# Patient Record
Sex: Female | Born: 2000 | Race: White | Hispanic: No | Marital: Single | Smoking: Never smoker
Health system: Southern US, Community
[De-identification: ages and names within clinical notes are randomized; demographics above are authoritative.]

---
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
## 2022-01-14 ENCOUNTER — Ambulatory Visit
Admission: RE | Admit: 2022-01-14 | Discharge: 2022-01-14 | Disposition: A | Discharge status: Home / Self Care | Payer: BC Managed Care – PPO | Source: Ambulatory Visit

## 2022-01-14 DIAGNOSIS — S8001XA Contusion of right knee, initial encounter: Secondary | ICD-10-CM | POA: Diagnosis not present

## 2022-01-14 DIAGNOSIS — S7011XA Contusion of right thigh, initial encounter: Secondary | ICD-10-CM | POA: Diagnosis not present

## 2022-01-14 DIAGNOSIS — S5011XA Contusion of right forearm, initial encounter: Secondary | ICD-10-CM

## 2022-01-14 DIAGNOSIS — S8002XA Contusion of left knee, initial encounter: Secondary | ICD-10-CM | POA: Diagnosis not present
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
## 2022-01-14 NOTE — Discharge Instructions (Addendum)
Take over-the-counter Tylenol or ibuprofen according to the package instructions as needed for pain and inflammation.  You may apply ice to your various bruises for 20 minutes at a time 2-3 times a day to help with pain and inflammation.  Do not apply the ice directly to your skin make sure there is a cloth between your eyes and skin to prevent skin damage.  If any of the bruised areas become red, hot, swollen, or tender please return for reevaluation.

## 2023-01-02 ENCOUNTER — Ambulatory Visit
Admission: RE | Admit: 2023-01-02 | Discharge: 2023-01-02 | Disposition: A | Discharge status: Home / Self Care | Payer: BC Managed Care – PPO | Source: Ambulatory Visit | Attending: Physician Assistant | Admitting: Physician Assistant

## 2023-01-02 VITALS — BP 105/71 | HR 83 | Temp 98.6°F | Resp 14 | Ht 64.0 in | Wt 120.0 lb

## 2023-01-02 DIAGNOSIS — R051 Acute cough: Secondary | ICD-10-CM | POA: Diagnosis not present

## 2023-01-02 DIAGNOSIS — J069 Acute upper respiratory infection, unspecified: Secondary | ICD-10-CM

## 2023-01-02 MED ORDER — PREDNISONE 20 MG PO TABS: 40.0000 mg | ORAL_TABLET | Freq: Every day | ORAL | 0 refills | Status: AC

## 2023-01-02 MED ORDER — AZITHROMYCIN 250 MG PO TABS: 250.0000 mg | ORAL_TABLET | Freq: Every day | ORAL | 0 refills | Status: DC

## 2023-01-02 NOTE — ED Triage Notes (Signed)
Patient c/o cough and chest congestion for over 2 weeks.  Patient denies fevers.  

## 2023-01-02 NOTE — ED Provider Notes (Signed)
[facility]    CSN: [id number] Arrival date & time: 01/02/23  4098      History   Chief Complaint Chief Complaint  Patient presents with   Cough    Appointment    HPI Kaylee Williams is a 22 y.o. female presenting for approximately 3-week history of cough, congestion, runny nose.  Patient says symptoms seem to get better and then they will get worse again.  States that sometimes the symptoms will be very heavy on the cough and other times having congestion.  She denies fever, ear pain, sore throat.  Has not had any sinus pain, chest tightness or shortness of breath.  No vomiting or diarrhea.  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  She has tried over-the-counter antihistamines but says that has not helped symptoms and she does not really have a history of use.  No other complaints.  HPI  History reviewed. No pertinent past medical history.  There are no problems to display for this patient.   History reviewed. No pertinent surgical history.  OB History   No obstetric history on file.      Home Medications    Prior to Admission medications   Medication Sig Start Date End Date Taking? Authorizing Provider  azithromycin (ZITHROMAX) 250 MG tablet Take 1 tablet (250 mg total) by mouth daily. Take first 2 tablets together, then 1 every day until finished. 01/02/23  Yes Shirlee Latch, PA-C  predniSONE (DELTASONE) 20 MG tablet Take 2 tablets (40 mg total) by mouth daily for 5 days. 01/02/23 01/07/23 Yes Shirlee Latch, PA-C    Family History Family History  Problem Relation Age of Onset   Healthy Mother    Healthy Father     Social History Social History   Tobacco Use   Smoking status: Never   Smokeless tobacco: Never  Vaping Use   Vaping Use: Never used  Substance Use Topics   Alcohol use: Never   Drug use: Never     Allergies    Amoxicillin   Review of Systems Review of Systems  Constitutional:  Negative for chills, diaphoresis, fatigue and fever.  HENT:  Positive for congestion and rhinorrhea. Negative for ear pain, sinus pressure, sinus pain and sore throat.   Respiratory:  Positive for cough. Negative for shortness of breath.   Gastrointestinal:  Negative for abdominal pain, nausea and vomiting.  Musculoskeletal:  Negative for arthralgias and myalgias.  Skin:  Negative for rash.  Neurological:  Negative for weakness and headaches.  Hematological:  Negative for adenopathy.     Physical Exam Triage Vital Signs ED Triage Vitals  Enc Vitals Group     BP 01/02/23 0949 105/71     Pulse Rate 01/02/23 0949 83     Resp 01/02/23 0949 14     Temp 01/02/23 0949 98.6 F (37 C)     Temp Source 01/02/23 0949 Oral     SpO2 01/02/23 0949 98 %     Weight 01/02/23 0947 120 lb (54.4 kg)     Height 01/02/23 0947 5\' 4"  (1.626 m)     Head Circumference --      Peak Flow --      Pain Score 01/02/23 0947 0     Pain Loc --      Pain Edu? --      Excl. in GC? --    No data  found.  Updated Vital Signs BP 105/71 (BP Location: Left Arm)   Pulse 83   Temp 98.6 F (37 C) (Oral)   Resp 14   Ht 5\' 4"  (1.626 m)   Wt 120 lb (54.4 kg)   LMP 12/19/2022 (Approximate)   SpO2 98%   BMI 20.60 kg/m       Physical Exam Vitals and nursing note reviewed.  Constitutional:      General: She is not in acute distress.    Appearance: Normal appearance. She is not ill-appearing or toxic-appearing.  HENT:     Head: Normocephalic and atraumatic.     Right Ear: Tympanic membrane, ear canal and external ear normal.     Left Ear: Tympanic membrane, ear canal and external ear normal.     Nose: Congestion present.     Mouth/Throat:     Mouth: Mucous membranes are moist.     Pharynx: Oropharynx is clear.  Eyes:     General: No scleral icterus.       Right eye: No discharge.        Left eye: No discharge.      Conjunctiva/sclera: Conjunctivae normal.  Cardiovascular:     Rate and Rhythm: Normal rate and regular rhythm.     Heart sounds: Normal heart sounds.  Pulmonary:     Effort: Pulmonary effort is normal. No respiratory distress.     Breath sounds: Normal breath sounds.  Musculoskeletal:     Cervical back: Neck supple.  Skin:    General: Skin is dry.  Neurological:     General: No focal deficit present.     Mental Status: She is alert. Mental status is at baseline.     Motor: No weakness.     Gait: Gait normal.  Psychiatric:        Mood and Affect: Mood normal.        Behavior: Behavior normal.        Thought Content: Thought content normal.      UC Treatments / Results  Labs (all labs ordered are listed, but only abnormal results are displayed) Labs Reviewed - No data to display  EKG   Radiology No results found.  Procedures Procedures (including critical care time)  Medications Ordered in UC Medications - No data to display  Initial Impression / Assessment and Plan / UC Course  I have reviewed the triage vital signs and the nursing notes.  Pertinent labs & imaging results that were available during my care of the patient were reviewed by me and considered in my medical decision making (see chart for details).   22 year old female presents for 3-week history of cough, congestion, runny nose.  Symptoms seem to get better at times and then worse again.  No significant recent worsening.  No fever, sinus pain, ear pain, sore throat, breathing difficulty.  Vitals normal and stable patient is overall well-appearing.  Exam significant for nasal congestion only.  Chest clear auscultation.  Advised patient symptoms likely due to back-to-back viral illnesses.  However, since she has not really had any real improvement in symptoms, we will try prednisone to see if that helps.  Some of this could also be allergy related so that may be helpful.  Advised if no improvement in the  next week then she may consider taking the azithromycin to cover for any potential of walking pneumonia.  Reviewed return and ED precautions.   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 - Start taking Mucinex over-the-counter increase rest and fluids. - We can try the corticosteroid since you have been sick for 3 weeks.  If no improvement in the next week then fill the antibiotic prescription and try that. - If at any point you develop fever or start to feel worse he should return for reevaluation to include likely getting a chest x-ray.     ED Prescriptions     Medication Sig Dispense Auth. Provider   predniSONE (DELTASONE) 20 MG tablet Take 2 tablets (40 mg total) by mouth daily for 5 days. 10 tablet Eusebio Friendly B, PA-C   azithromycin (ZITHROMAX) 250 MG tablet Take 1 tablet (250 mg total) by mouth daily. Take first 2 tablets together, then 1 every day until finished. 6 tablet Gareth Morgan      PDMP not reviewed this encounter.   Shirlee Latch, PA-C 01/02/23 1006

## 2023-01-02 NOTE — Discharge Instructions (Addendum)
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 - Start taking Mucinex over-the-counter increase rest and fluids. - We can try the corticosteroid since you have been sick for 3 weeks.  If no improvement in the next week then fill the antibiotic prescription and try that. - If at any point you develop fever or start to feel worse he should return for reevaluation to include likely getting a chest x-ray.

## 2023-09-29 ENCOUNTER — Ambulatory Visit
Admission: RE | Admit: 2023-09-29 | Discharge: 2023-09-29 | Disposition: A | Discharge status: Home / Self Care | Payer: BC Managed Care – PPO | Source: Ambulatory Visit | Attending: Family Medicine | Admitting: Family Medicine

## 2023-09-29 VITALS — HR 90 | Temp 98.2°F | Resp 18

## 2023-09-29 DIAGNOSIS — J069 Acute upper respiratory infection, unspecified: Secondary | ICD-10-CM

## 2023-09-29 NOTE — ED Triage Notes (Signed)
 Patient presents with c/o productive cough, nasal congestion, and sore throat x 5 days. Patient is taking mucinex fot the symptoms. Patient is requesting a note for work.

## 2023-09-29 NOTE — Discharge Instructions (Addendum)
 You have a viral respiratory infection that will gradually improve over the next 7 days.  Cough may last up to 3 weeks.    You can take Tylenol and/or Ibuprofen as needed for fever reduction and pain relief.    For cough: honey 1/2 to 1 teaspoon (you can dilute the honey in water or another fluid).  You can also use guaifenesin and dextromethorphan for cough. You can use a humidifier for chest congestion and cough.  If you don't have a humidifier, you can sit in the bathroom with the hot shower running.      For sore throat: try warm salt water gargles, Mucinex sore throat cough drops or cepacol lozenges, throat spray, warm tea or water with lemon/honey, popsicles or ice, or OTC cold relief medicine for throat discomfort. You can also purchase chloraseptic spray at the pharmacy or dollar store.   For congestion: take a daily anti-histamine like Zyrtec, Claritin, and a oral decongestant, such as pseudoephedrine.  You can also use Flonase 1-2 sprays in each nostril daily. Afrin is also a good option, if you do not have high blood pressure.    It is important to stay hydrated: drink plenty of fluids (water, gatorade/powerade/pedialyte, juices, or teas) to keep your throat moisturized and help further relieve irritation/discomfort.    Return or go to the Emergency Department if symptoms worsen or do not improve in the next few days

## 2023-09-29 NOTE — ED Provider Notes (Signed)
 [facility]    CSN: [id number] Arrival date & time: 09/29/23  1220      History   Chief Complaint Chief Complaint  Patient presents with   Letter for School/Work    Entered by patient   Sore Throat   Cough   Nasal Congestion    HPI Kaylee Williams is a 23 y.o. female.   HPI  History obtained from the patient. Kaylee Williams presents for doctors note to return to work.  She has missed 2 days of work already.  She has been having nasal congestion, hoarseness, cough, body aches and chest tightness that have mostly resolved. Tmax 99 F yesterday.  Hs been taking OTC medications that are helping.     History reviewed. No pertinent past medical history.  There are no active problems to display for this patient.   History reviewed. No pertinent surgical history.  OB History   No obstetric history on file.      Home Medications    Prior to Admission medications   Not on File    Family History Family History  Problem Relation Age of Onset   Healthy Mother    Healthy Father     Social History Social History   Tobacco Use   Smoking status: Never   Smokeless tobacco: Never  Vaping Use   Vaping status: Never Used  Substance Use Topics   Alcohol use: Never   Drug use: Never     Allergies   Amoxicillin   Review of Systems Review of Systems: negative unless otherwise stated in HPI.      Physical Exam Triage Vital Signs ED Triage Vitals  Encounter Vitals Group     BP --      Systolic BP Percentile --      Diastolic BP Percentile --      Pulse Rate 09/29/23 1236 90     Resp 09/29/23 1236 18     Temp 09/29/23 1236 98.2 F (36.8 C)     Temp Source 09/29/23 1236 Oral     SpO2 09/29/23 1236 97 %     Weight --      Height --      Head Circumference --      Peak Flow --      Pain Score 09/29/23 1239 0     Pain Loc --      Pain Education --      Exclude from Growth Chart --    No data found.  Updated Vital Signs Pulse 90   Temp  98.2 F (36.8 C) (Oral)   Resp 18   LMP 09/15/2023 (Approximate)   SpO2 97%   Visual Acuity Right Eye Distance:   Left Eye Distance:   Bilateral Distance:    Right Eye Near:   Left Eye Near:    Bilateral Near:     Physical Exam GEN:     alert, non-toxic appearing female in no distress    HENT:  mucus membranes moist, oropharyngeal without lesions or erythema, no tonsillar hypertrophy or exudates, clear nasal discharge, hoarse voice EYES:   no scleral injection or discharge NECK:  normal ROM, no meningismus   RESP:  no increased work of breathing, clear to auscultation bilaterally CVS:   regular rate and rhythm Skin:   warm and dry    UC Treatments / Results  Labs (all labs ordered are listed, but only abnormal results are displayed) Labs Reviewed - No data to display  EKG  Radiology No results found.  Procedures Procedures (including critical care time)  Medications Ordered in UC Medications - No data to display  Initial Impression / Assessment and Plan / UC Course  I have reviewed the triage vital signs and the nursing notes.  Pertinent labs & imaging results that were available during my care of the patient were reviewed by me and considered in my medical decision making (see chart for details).       Pt is a 23 y.o. female who presents for 5 days of respiratory symptoms. Kaylee Williams is afebrile here without recent antipyretics. Satting well on room air. Overall pt is non-toxic appearing, well hydrated, without respiratory distress. Pulmonary exam is unremarkable.  COVID and influenza panel deferred due to duration of symptoms.  History consistent with viral respiratory illness. Discussed symptomatic treatment.  Typical duration of symptoms discussed.  Return to work note provided at patient's request.  Return and ED precautions given and voiced understanding. Discussed MDM, treatment plan and plan for follow-up with patient who agrees with plan.     Final  Clinical Impressions(s) / UC Diagnoses   Final diagnoses:  Viral URI with cough     Discharge Instructions      You have a viral respiratory infection that will gradually improve over the next 7 days.  Cough may last up to 3 weeks.    You can take Tylenol and/or Ibuprofen as needed for fever reduction and pain relief.    For cough: honey 1/2 to 1 teaspoon (you can dilute the honey in water or another fluid).  You can also use guaifenesin and dextromethorphan for cough. You can use a humidifier for chest congestion and cough.  If you don't have a humidifier, you can sit in the bathroom with the hot shower running.      For sore throat: try warm salt water gargles, Mucinex sore throat cough drops or cepacol lozenges, throat spray, warm tea or water with lemon/honey, popsicles or ice, or OTC cold relief medicine for throat discomfort. You can also purchase chloraseptic spray at the pharmacy or dollar store.   For congestion: take a daily anti-histamine like Zyrtec, Claritin, and a oral decongestant, such as pseudoephedrine.  You can also use Flonase 1-2 sprays in each nostril daily. Afrin is also a good option, if you do not have high blood pressure.    It is important to stay hydrated: drink plenty of fluids (water, gatorade/powerade/pedialyte, juices, or teas) to keep your throat moisturized and help further relieve irritation/discomfort.    Return or go to the Emergency Department if symptoms worsen or do not improve in the next few days      ED Prescriptions   None    PDMP not reviewed this encounter.   Kriste Berth, DO 09/29/23 1323
